# Patient Record
Sex: Female | Born: 1990 | Race: White | Hispanic: No | Marital: Single | Smoking: Current every day smoker
Health system: Southern US, Community
[De-identification: ages and names within clinical notes are randomized; demographics above are authoritative.]

## PROBLEM LIST (undated history)

## (undated) DIAGNOSIS — F909 Attention-deficit hyperactivity disorder, unspecified type: Secondary | ICD-10-CM

## (undated) DIAGNOSIS — M419 Scoliosis, unspecified: Secondary | ICD-10-CM

## (undated) DIAGNOSIS — S81819A Laceration without foreign body, unspecified lower leg, initial encounter: Secondary | ICD-10-CM

---

## 2013-01-12 ENCOUNTER — Encounter (HOSPITAL_COMMUNITY): Payer: Self-pay | Admitting: Emergency Medicine

## 2013-01-12 ENCOUNTER — Emergency Department (HOSPITAL_COMMUNITY)
Admission: EM | Admit: 2013-01-12 | Discharge: 2013-01-13 | Disposition: A | Discharge status: Home / Self Care | Payer: Self-pay | Attending: Emergency Medicine | Admitting: Emergency Medicine

## 2013-01-12 DIAGNOSIS — S0083XA Contusion of other part of head, initial encounter: Secondary | ICD-10-CM

## 2013-01-12 DIAGNOSIS — S01511A Laceration without foreign body of lip, initial encounter: Secondary | ICD-10-CM

## 2013-01-12 DIAGNOSIS — Z8659 Personal history of other mental and behavioral disorders: Secondary | ICD-10-CM | POA: Insufficient documentation

## 2013-01-12 DIAGNOSIS — S01501A Unspecified open wound of lip, initial encounter: Secondary | ICD-10-CM | POA: Insufficient documentation

## 2013-01-12 DIAGNOSIS — S0003XA Contusion of scalp, initial encounter: Secondary | ICD-10-CM | POA: Insufficient documentation

## 2013-01-12 DIAGNOSIS — F17200 Nicotine dependence, unspecified, uncomplicated: Secondary | ICD-10-CM | POA: Insufficient documentation

## 2013-01-12 DIAGNOSIS — S1093XA Contusion of unspecified part of neck, initial encounter: Secondary | ICD-10-CM

## 2013-01-12 DIAGNOSIS — Z8739 Personal history of other diseases of the musculoskeletal system and connective tissue: Secondary | ICD-10-CM | POA: Insufficient documentation

## 2013-01-12 HISTORY — DX: Scoliosis, unspecified: M41.9

## 2013-01-12 HISTORY — DX: Attention-deficit hyperactivity disorder, unspecified type: F90.9

## 2013-01-12 MED ORDER — OXYCODONE-ACETAMINOPHEN 5-325 MG PO TABS
2.0000 | ORAL_TABLET | Freq: Once | ORAL | Status: AC
  Administered 2013-01-12: 2 via ORAL
  Filled 2013-01-12: qty 2
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
## 2013-01-12 NOTE — ED Notes (Signed)
MD at bedside. 

## 2013-01-13 MED ORDER — OXYCODONE-ACETAMINOPHEN 5-325 MG PO TABS: 2.0000 | ORAL_TABLET | ORAL | Status: DC | PRN
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
## 2013-01-13 NOTE — ED Notes (Signed)
MD at bedside. 

## 2013-01-19 ENCOUNTER — Emergency Department (HOSPITAL_COMMUNITY)
Admission: EM | Admit: 2013-01-19 | Discharge: 2013-01-19 | Disposition: A | Discharge status: Home / Self Care | Payer: Self-pay | Attending: Emergency Medicine | Admitting: Emergency Medicine

## 2013-01-19 ENCOUNTER — Encounter (HOSPITAL_COMMUNITY): Payer: Self-pay | Admitting: Emergency Medicine

## 2013-01-19 DIAGNOSIS — Z79899 Other long term (current) drug therapy: Secondary | ICD-10-CM | POA: Insufficient documentation

## 2013-01-19 DIAGNOSIS — Z4802 Encounter for removal of sutures: Secondary | ICD-10-CM

## 2013-01-19 DIAGNOSIS — M4120 Other idiopathic scoliosis, site unspecified: Secondary | ICD-10-CM | POA: Insufficient documentation

## 2013-01-19 DIAGNOSIS — F17200 Nicotine dependence, unspecified, uncomplicated: Secondary | ICD-10-CM | POA: Insufficient documentation

## 2013-01-19 DIAGNOSIS — F909 Attention-deficit hyperactivity disorder, unspecified type: Secondary | ICD-10-CM | POA: Insufficient documentation

## 2013-01-19 NOTE — ED Notes (Signed)
Pt reports needing sutures removed from her upper lip, were placed one week ago, no distress noted, no complaints.
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
## 2013-01-23 NOTE — ED Provider Notes (Signed)
Medical screening examination/treatment/procedure(s) were performed by non-physician practitioner and as supervising physician I was immediately available for consultation/collaboration.  EKG Interpretation   None        Vallarie Fei K Linker, MD 01/23/13 1503 

## 2013-03-20 ENCOUNTER — Emergency Department (HOSPITAL_COMMUNITY)
Admission: EM | Admit: 2013-03-20 | Discharge: 2013-03-20 | Disposition: A | Discharge status: Home / Self Care | Payer: Self-pay | Attending: Emergency Medicine | Admitting: Emergency Medicine

## 2013-03-20 ENCOUNTER — Emergency Department (HOSPITAL_COMMUNITY): Payer: Self-pay

## 2013-03-20 ENCOUNTER — Encounter (HOSPITAL_COMMUNITY): Payer: Self-pay | Admitting: Emergency Medicine

## 2013-03-20 DIAGNOSIS — Z4801 Encounter for change or removal of surgical wound dressing: Secondary | ICD-10-CM | POA: Insufficient documentation

## 2013-03-20 DIAGNOSIS — S81812A Laceration without foreign body, left lower leg, initial encounter: Secondary | ICD-10-CM

## 2013-03-20 DIAGNOSIS — G8911 Acute pain due to trauma: Secondary | ICD-10-CM | POA: Insufficient documentation

## 2013-03-20 DIAGNOSIS — F17200 Nicotine dependence, unspecified, uncomplicated: Secondary | ICD-10-CM | POA: Insufficient documentation

## 2013-03-20 DIAGNOSIS — Z8659 Personal history of other mental and behavioral disorders: Secondary | ICD-10-CM | POA: Insufficient documentation

## 2013-03-20 DIAGNOSIS — Z5189 Encounter for other specified aftercare: Secondary | ICD-10-CM

## 2013-03-20 DIAGNOSIS — Z79899 Other long term (current) drug therapy: Secondary | ICD-10-CM | POA: Insufficient documentation

## 2013-03-20 DIAGNOSIS — M79609 Pain in unspecified limb: Secondary | ICD-10-CM | POA: Insufficient documentation

## 2013-03-20 DIAGNOSIS — Z8739 Personal history of other diseases of the musculoskeletal system and connective tissue: Secondary | ICD-10-CM | POA: Insufficient documentation

## 2013-03-20 LAB — CBC WITH DIFFERENTIAL/PLATELET
BASOS PCT: 0 % (ref 0–1)
Basophils Absolute: 0 10*3/uL (ref 0.0–0.1)
EOS ABS: 0.2 10*3/uL (ref 0.0–0.7)
Eosinophils Relative: 2 % (ref 0–5)
HEMATOCRIT: 35 % — AB (ref 36.0–46.0)
Hemoglobin: 11.5 g/dL — ABNORMAL LOW (ref 12.0–15.0)
Lymphocytes Relative: 26 % (ref 12–46)
Lymphs Abs: 1.9 10*3/uL (ref 0.7–4.0)
MCH: 30.8 pg (ref 26.0–34.0)
MCHC: 32.9 g/dL (ref 30.0–36.0)
MCV: 93.8 fL (ref 78.0–100.0)
MONO ABS: 0.9 10*3/uL (ref 0.1–1.0)
Monocytes Relative: 12 % (ref 3–12)
Neutro Abs: 4.5 10*3/uL (ref 1.7–7.7)
Neutrophils Relative %: 61 % (ref 43–77)
Platelets: 183 10*3/uL (ref 150–400)
RBC: 3.73 MIL/uL — ABNORMAL LOW (ref 3.87–5.11)
RDW: 13.6 % (ref 11.5–15.5)
WBC: 7.4 10*3/uL (ref 4.0–10.5)

## 2013-03-20 LAB — BASIC METABOLIC PANEL
BUN: 9 mg/dL (ref 6–23)
CALCIUM: 8.8 mg/dL (ref 8.4–10.5)
CO2: 24 mEq/L (ref 19–32)
CREATININE: 0.63 mg/dL (ref 0.50–1.10)
Chloride: 106 mEq/L (ref 96–112)
Glucose, Bld: 92 mg/dL (ref 70–99)
Potassium: 4 mEq/L (ref 3.7–5.3)
Sodium: 142 mEq/L (ref 137–147)

## 2013-03-20 MED ORDER — MORPHINE SULFATE 4 MG/ML IJ SOLN
4.0000 mg | Freq: Once | INTRAMUSCULAR | Status: AC
Start: 2013-03-20 — End: 2013-03-20
  Administered 2013-03-20: 4 mg via INTRAVENOUS
  Filled 2013-03-20: qty 1

## 2013-03-20 MED ORDER — CLINDAMYCIN PHOSPHATE 600 MG/50ML IV SOLN
600.0000 mg | Freq: Once | INTRAVENOUS | Status: AC
  Administered 2013-03-20: 600 mg via INTRAVENOUS
  Filled 2013-03-20: qty 50

## 2013-03-20 MED ORDER — ONDANSETRON HCL 4 MG/2ML IJ SOLN
4.0000 mg | Freq: Once | INTRAMUSCULAR | Status: AC
  Administered 2013-03-20: 4 mg via INTRAVENOUS
  Filled 2013-03-20: qty 2

## 2013-03-20 MED ORDER — OXYCODONE-ACETAMINOPHEN 5-325 MG PO TABS: 1.0000 | ORAL_TABLET | Freq: Four times a day (QID) | ORAL | Status: DC | PRN

## 2013-03-20 MED ORDER — SULFAMETHOXAZOLE-TRIMETHOPRIM 800-160 MG PO TABS: 1.0000 | ORAL_TABLET | Freq: Two times a day (BID) | ORAL | Status: DC

## 2013-03-20 MED ORDER — SODIUM CHLORIDE 0.9 % IV SOLN
INTRAVENOUS | Status: DC
  Administered 2013-03-20: 13:00:00 via INTRAVENOUS
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
## 2013-03-27 ENCOUNTER — Encounter (HOSPITAL_COMMUNITY): Payer: Self-pay | Admitting: Emergency Medicine

## 2013-03-27 ENCOUNTER — Emergency Department (HOSPITAL_COMMUNITY)
Admission: EM | Admit: 2013-03-27 | Discharge: 2013-03-27 | Disposition: A | Discharge status: Home / Self Care | Payer: Self-pay | Attending: Emergency Medicine | Admitting: Emergency Medicine

## 2013-03-27 DIAGNOSIS — Z8739 Personal history of other diseases of the musculoskeletal system and connective tissue: Secondary | ICD-10-CM | POA: Insufficient documentation

## 2013-03-27 DIAGNOSIS — Z4802 Encounter for removal of sutures: Secondary | ICD-10-CM | POA: Insufficient documentation

## 2013-03-27 DIAGNOSIS — F17200 Nicotine dependence, unspecified, uncomplicated: Secondary | ICD-10-CM | POA: Insufficient documentation

## 2013-03-27 DIAGNOSIS — Z8659 Personal history of other mental and behavioral disorders: Secondary | ICD-10-CM | POA: Insufficient documentation

## 2013-03-27 HISTORY — DX: Laceration without foreign body, unspecified lower leg, initial encounter: S81.819A

## 2013-03-27 NOTE — Discharge Instructions (Signed)
Suture Removal, Care After Refer to this sheet in the next few weeks. These instructions provide you with information on caring for yourself after your procedure. Your health care provider may also give you more specific instructions. Your treatment has been planned according to current medical practices, but problems sometimes occur. Call your health care provider if you have any problems or questions after your procedure. WHAT TO EXPECT AFTER THE PROCEDURE After your stitches (sutures) are removed, it is typical to have the following:  Some discomfort and swelling in the wound area.  Slight redness in the area. HOME CARE INSTRUCTIONS   If you have skin adhesive strips over the wound area, do not take the strips off. They will fall off on their own in a few days. If the strips remain in place after 14 days, you may remove them.  Change any bandages (dressings) at least once a day or as directed by your health care provider. If the bandage sticks, soak it off with warm, soapy water.  Apply cream or ointment only as directed by your health care provider. If using cream or ointment, wash the area with soap and water 2 times a day to remove all the cream or ointment. Rinse off the soap and pat the area dry with a clean towel.  Keep the wound area dry and clean. If the bandage becomes wet or dirty, or if it develops a bad smell, change it as soon as possible.  Continue to protect the wound from injury.  Use sunscreen when out in the sun. New scars become sunburned easily. SEEK MEDICAL CARE IF:  You have increasing redness, swelling, or pain in the wound.  You see pus coming from the wound.  You have a fever.  You notice a bad smell coming from the wound or dressing.  Your wound breaks open (edges not staying together). Document Released: 10/25/2000 Document Revised: 11/20/2012 Document Reviewed: 09/11/2012 [employer] Patient Information 2014 [employer].  
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
## 2013-03-28 NOTE — ED Provider Notes (Signed)
Medical screening examination/treatment/procedure(s) were performed by non-physician practitioner and as supervising physician I was immediately available for consultation/collaboration.  EKG Interpretation   None         Candyce ChurnJohn David Angely Dietz III, MD 03/28/13 0002

## 2013-05-28 ENCOUNTER — Emergency Department (HOSPITAL_COMMUNITY)
Admission: EM | Admit: 2013-05-28 | Discharge: 2013-05-28 | Disposition: A | Discharge status: Home / Self Care | Payer: Self-pay | Attending: Emergency Medicine | Admitting: Emergency Medicine

## 2013-05-28 ENCOUNTER — Encounter (HOSPITAL_COMMUNITY): Payer: Self-pay | Admitting: Emergency Medicine

## 2013-05-28 DIAGNOSIS — Z792 Long term (current) use of antibiotics: Secondary | ICD-10-CM | POA: Insufficient documentation

## 2013-05-28 DIAGNOSIS — Z87828 Personal history of other (healed) physical injury and trauma: Secondary | ICD-10-CM | POA: Insufficient documentation

## 2013-05-28 DIAGNOSIS — M5380 Other specified dorsopathies, site unspecified: Secondary | ICD-10-CM | POA: Insufficient documentation

## 2013-05-28 DIAGNOSIS — F17200 Nicotine dependence, unspecified, uncomplicated: Secondary | ICD-10-CM | POA: Insufficient documentation

## 2013-05-28 DIAGNOSIS — Z8659 Personal history of other mental and behavioral disorders: Secondary | ICD-10-CM | POA: Insufficient documentation

## 2013-05-28 DIAGNOSIS — IMO0002 Reserved for concepts with insufficient information to code with codable children: Secondary | ICD-10-CM | POA: Insufficient documentation

## 2013-05-28 DIAGNOSIS — M4120 Other idiopathic scoliosis, site unspecified: Secondary | ICD-10-CM | POA: Insufficient documentation

## 2013-05-28 DIAGNOSIS — M62830 Muscle spasm of back: Secondary | ICD-10-CM

## 2013-05-28 MED ORDER — DIAZEPAM 5 MG PO TABS
5.0000 mg | ORAL_TABLET | Freq: Once | ORAL | Status: AC
  Administered 2013-05-28: 5 mg via ORAL
  Filled 2013-05-28: qty 1

## 2013-05-28 MED ORDER — IBUPROFEN 600 MG PO TABS: 600.0000 mg | ORAL_TABLET | Freq: Four times a day (QID) | ORAL | Status: AC | PRN

## 2013-05-28 MED ORDER — KETOROLAC TROMETHAMINE 30 MG/ML IJ SOLN
30.0000 mg | Freq: Once | INTRAMUSCULAR | Status: AC
  Administered 2013-05-28: 30 mg via INTRAMUSCULAR
  Filled 2013-05-28: qty 1

## 2013-05-28 MED ORDER — DIAZEPAM 5 MG PO TABS: 5.0000 mg | ORAL_TABLET | Freq: Four times a day (QID) | ORAL | Status: AC | PRN

## 2013-05-28 NOTE — ED Notes (Signed)
Pt woke up with pain from cervical spine to lumbar this morning.  Denies known injury.  Ambulatory to triage.

## 2013-05-28 NOTE — Discharge Instructions (Signed)
Musculoskeletal Pain °Musculoskeletal pain is muscle and boney aches and pains. These pains can occur in any part of the body. Your caregiver may treat you without knowing the cause of the pain. They may treat you if blood or urine tests, X-rays, and other tests were normal.  °CAUSES °There is often not a definite cause or reason for these pains. These pains may be caused by a type of germ (virus). The discomfort may also come from overuse. Overuse includes working out too hard when your body is not fit. Boney aches also come from weather changes. Bone is sensitive to atmospheric pressure changes. °HOME CARE INSTRUCTIONS  °· Ask when your test results will be ready. Make sure you get your test results. °· Only take over-the-counter or prescription medicines for pain, discomfort, or fever as directed by your caregiver. If you were given medications for your condition, do not drive, operate machinery or power tools, or sign legal documents for 24 hours. Do not drink alcohol. Do not take sleeping pills or other medications that may interfere with treatment. °· Continue all activities unless the activities cause more pain. When the pain lessens, slowly resume normal activities. Gradually increase the intensity and duration of the activities or exercise. °· During periods of severe pain, bed rest may be helpful. Lay or sit in any position that is comfortable. °· Putting ice on the injured area. °· Put ice in a bag. °· Place a towel between your skin and the bag. °· Leave the ice on for 15 to 20 minutes, 3 to 4 times a day. °· Follow up with your caregiver for continued problems and no reason can be found for the pain. If the pain becomes worse or does not go away, it may be necessary to repeat tests or do additional testing. Your caregiver may need to look further for a possible cause. °SEEK IMMEDIATE MEDICAL CARE IF: °· You have pain that is getting worse and is not relieved by medications. °· You develop chest pain  that is associated with shortness or breath, sweating, feeling sick to your stomach (nauseous), or throw up (vomit). °· Your pain becomes localized to the abdomen. °· You develop any new symptoms that seem different or that concern you. °MAKE SURE YOU:  °· Understand these instructions. °· Will watch your condition. °· Will get help right away if you are not doing well or get worse. °Document Released: 01/30/2005 Document Revised: 04/24/2011 Document Reviewed: 10/04/2012 °[employer]® Patient Information ©2014 [employer]. ° °
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
## 2013-05-29 NOTE — ED Provider Notes (Signed)
Medical screening examination/treatment/procedure(s) were performed by non-physician practitioner and as supervising physician I was immediately available for consultation/collaboration.   EKG Interpretation None       Raeford RazorStephen Smaran Gaus, MD 05/29/13 1427
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
